# Patient Record
Sex: Female | Born: 1945 | Race: White | Hispanic: No | Marital: Single | Smoking: Current every day smoker
Health system: Southern US, Community
[De-identification: ages and names within clinical notes are randomized; demographics above are authoritative.]

## PROBLEM LIST (undated history)

## (undated) DIAGNOSIS — F419 Anxiety disorder, unspecified: Secondary | ICD-10-CM

## (undated) DIAGNOSIS — E079 Disorder of thyroid, unspecified: Secondary | ICD-10-CM

## (undated) DIAGNOSIS — I10 Essential (primary) hypertension: Secondary | ICD-10-CM

## (undated) HISTORY — PX: APPENDECTOMY: SHX54

---

## 2008-07-18 ENCOUNTER — Emergency Department: Payer: Self-pay | Admitting: Emergency Medicine

## 2009-08-03 ENCOUNTER — Ambulatory Visit: Payer: Self-pay | Admitting: Internal Medicine

## 2010-06-01 ENCOUNTER — Ambulatory Visit: Payer: Self-pay | Admitting: Internal Medicine

## 2010-11-20 ENCOUNTER — Ambulatory Visit: Payer: Self-pay | Admitting: Internal Medicine

## 2011-07-24 ENCOUNTER — Ambulatory Visit: Payer: Self-pay | Admitting: Internal Medicine

## 2011-09-02 ENCOUNTER — Ambulatory Visit: Payer: Self-pay | Admitting: Internal Medicine

## 2012-03-13 ENCOUNTER — Ambulatory Visit: Payer: Self-pay | Admitting: Unknown Physician Specialty

## 2012-03-16 LAB — PATHOLOGY REPORT

## 2012-09-21 ENCOUNTER — Ambulatory Visit: Payer: Self-pay | Admitting: Internal Medicine

## 2013-09-22 ENCOUNTER — Ambulatory Visit: Payer: Self-pay | Admitting: Internal Medicine

## 2014-06-05 ENCOUNTER — Ambulatory Visit: Payer: Self-pay

## 2014-06-05 LAB — RAPID STREP-A WITH REFLX: Micro Text Report: NEGATIVE

## 2014-06-08 LAB — BETA STREP CULTURE(ARMC)

## 2014-06-09 ENCOUNTER — Ambulatory Visit: Payer: Self-pay

## 2014-06-15 ENCOUNTER — Emergency Department: Payer: Self-pay | Admitting: Emergency Medicine

## 2015-03-28 ENCOUNTER — Other Ambulatory Visit: Payer: Self-pay | Admitting: Neurology

## 2015-03-28 DIAGNOSIS — G5139 Clonic hemifacial spasm, unspecified: Secondary | ICD-10-CM

## 2015-04-03 ENCOUNTER — Ambulatory Visit
Admission: RE | Admit: 2015-04-03 | Discharge: 2015-04-03 | Disposition: A | Discharge status: Home / Self Care | Payer: Medicare HMO | Source: Ambulatory Visit | Attending: Neurology | Admitting: Neurology

## 2015-04-03 DIAGNOSIS — I679 Cerebrovascular disease, unspecified: Secondary | ICD-10-CM | POA: Diagnosis not present

## 2015-04-03 DIAGNOSIS — G513 Clonic hemifacial spasm: Secondary | ICD-10-CM | POA: Diagnosis present

## 2015-04-03 DIAGNOSIS — G5139 Clonic hemifacial spasm, unspecified: Secondary | ICD-10-CM

## 2015-04-03 MED ORDER — GADOBENATE DIMEGLUMINE 529 MG/ML IV SOLN
10.0000 mL | Freq: Once | INTRAVENOUS | Status: AC | PRN
  Administered 2015-04-03: 9 mL via INTRAVENOUS

## 2015-04-05 ENCOUNTER — Ambulatory Visit: Payer: Self-pay

## 2015-08-28 ENCOUNTER — Other Ambulatory Visit: Payer: Self-pay | Admitting: Internal Medicine

## 2015-08-28 DIAGNOSIS — Z1231 Encounter for screening mammogram for malignant neoplasm of breast: Secondary | ICD-10-CM

## 2015-09-12 ENCOUNTER — Ambulatory Visit
Admission: RE | Admit: 2015-09-12 | Discharge: 2015-09-12 | Disposition: A | Discharge status: Home / Self Care | Payer: Medicare HMO | Source: Ambulatory Visit | Attending: Internal Medicine | Admitting: Internal Medicine

## 2015-09-12 ENCOUNTER — Other Ambulatory Visit: Payer: Self-pay | Admitting: Internal Medicine

## 2015-09-12 DIAGNOSIS — N63 Unspecified lump in breast: Secondary | ICD-10-CM | POA: Insufficient documentation

## 2015-09-12 DIAGNOSIS — Z1231 Encounter for screening mammogram for malignant neoplasm of breast: Secondary | ICD-10-CM | POA: Insufficient documentation

## 2015-09-12 DIAGNOSIS — R921 Mammographic calcification found on diagnostic imaging of breast: Secondary | ICD-10-CM | POA: Diagnosis not present

## 2015-09-19 ENCOUNTER — Other Ambulatory Visit: Payer: Self-pay | Admitting: Internal Medicine

## 2015-09-19 DIAGNOSIS — R928 Other abnormal and inconclusive findings on diagnostic imaging of breast: Secondary | ICD-10-CM

## 2015-10-09 ENCOUNTER — Ambulatory Visit
Admission: RE | Admit: 2015-10-09 | Discharge: 2015-10-09 | Disposition: A | Discharge status: Home / Self Care | Payer: Medicare HMO | Source: Ambulatory Visit | Attending: Internal Medicine | Admitting: Internal Medicine

## 2015-10-09 DIAGNOSIS — R928 Other abnormal and inconclusive findings on diagnostic imaging of breast: Secondary | ICD-10-CM | POA: Insufficient documentation

## 2015-10-09 DIAGNOSIS — R920 Mammographic microcalcification found on diagnostic imaging of breast: Secondary | ICD-10-CM | POA: Diagnosis not present

## 2015-10-11 ENCOUNTER — Other Ambulatory Visit: Payer: Self-pay | Admitting: Internal Medicine

## 2015-10-11 DIAGNOSIS — R920 Mammographic microcalcification found on diagnostic imaging of breast: Secondary | ICD-10-CM

## 2015-10-18 ENCOUNTER — Ambulatory Visit: Payer: Medicare HMO

## 2015-10-23 ENCOUNTER — Ambulatory Visit
Admission: RE | Admit: 2015-10-23 | Discharge: 2015-10-23 | Disposition: A | Discharge status: Home / Self Care | Payer: Medicare HMO | Source: Ambulatory Visit | Attending: Internal Medicine | Admitting: Internal Medicine

## 2015-10-23 DIAGNOSIS — R920 Mammographic microcalcification found on diagnostic imaging of breast: Secondary | ICD-10-CM

## 2015-10-23 DIAGNOSIS — N6082 Other benign mammary dysplasias of left breast: Secondary | ICD-10-CM | POA: Insufficient documentation

## 2015-10-23 DIAGNOSIS — R921 Mammographic calcification found on diagnostic imaging of breast: Secondary | ICD-10-CM | POA: Insufficient documentation

## 2015-10-24 LAB — SURGICAL PATHOLOGY

## 2015-10-25 HISTORY — PX: BREAST BIOPSY: SHX20

## 2016-05-10 ENCOUNTER — Other Ambulatory Visit: Payer: Self-pay | Admitting: Internal Medicine

## 2016-05-10 DIAGNOSIS — R920 Mammographic microcalcification found on diagnostic imaging of breast: Secondary | ICD-10-CM

## 2016-05-27 ENCOUNTER — Ambulatory Visit
Admission: RE | Admit: 2016-05-27 | Discharge: 2016-05-27 | Disposition: A | Discharge status: Home / Self Care | Payer: Medicare HMO | Source: Ambulatory Visit | Attending: Internal Medicine | Admitting: Internal Medicine

## 2016-05-27 DIAGNOSIS — R920 Mammographic microcalcification found on diagnostic imaging of breast: Secondary | ICD-10-CM | POA: Insufficient documentation

## 2017-09-01 ENCOUNTER — Other Ambulatory Visit: Payer: Self-pay | Admitting: Internal Medicine

## 2017-09-01 DIAGNOSIS — Z1231 Encounter for screening mammogram for malignant neoplasm of breast: Secondary | ICD-10-CM

## 2017-10-01 ENCOUNTER — Other Ambulatory Visit: Payer: Self-pay | Admitting: Internal Medicine

## 2017-10-01 ENCOUNTER — Ambulatory Visit
Admission: RE | Admit: 2017-10-01 | Discharge: 2017-10-01 | Disposition: A | Discharge status: Home / Self Care | Payer: Medicare HMO | Source: Ambulatory Visit | Attending: Internal Medicine | Admitting: Internal Medicine

## 2017-10-01 DIAGNOSIS — Z1231 Encounter for screening mammogram for malignant neoplasm of breast: Secondary | ICD-10-CM | POA: Diagnosis present

## 2018-04-21 IMAGING — MG MM DIGITAL SCREENING BILAT W/ TOMO W/ CAD
8 of 10 series · 9 of 22 positions shown · non-contrast
Comparison: Previous exam(s).

CLINICAL DATA: Screening.

EXAM:
2D DIGITAL SCREENING BILATERAL MAMMOGRAM WITH 3D TOMO WITH CAD

[L MLO]
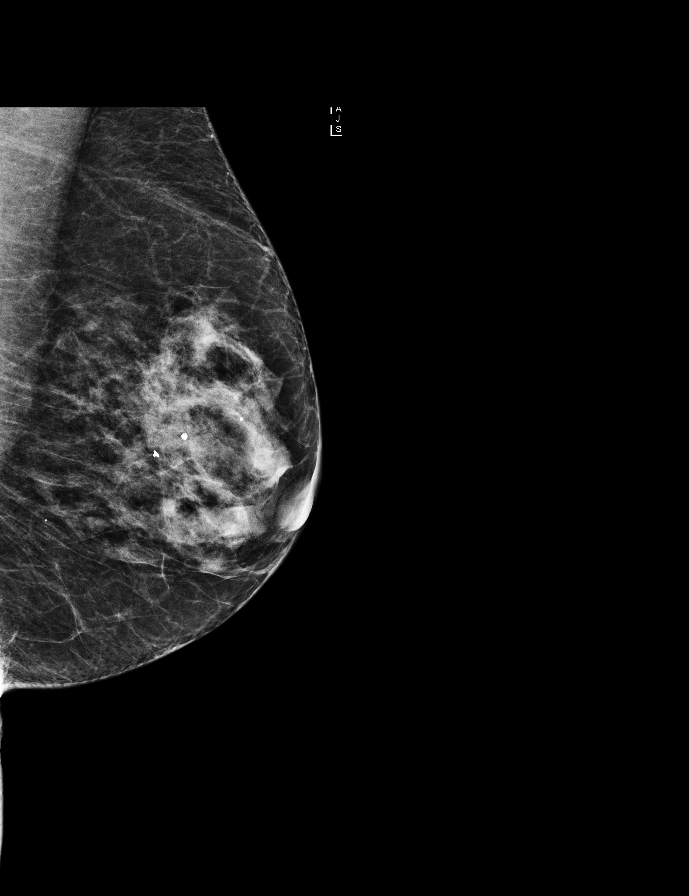

[R CC synth-2D]
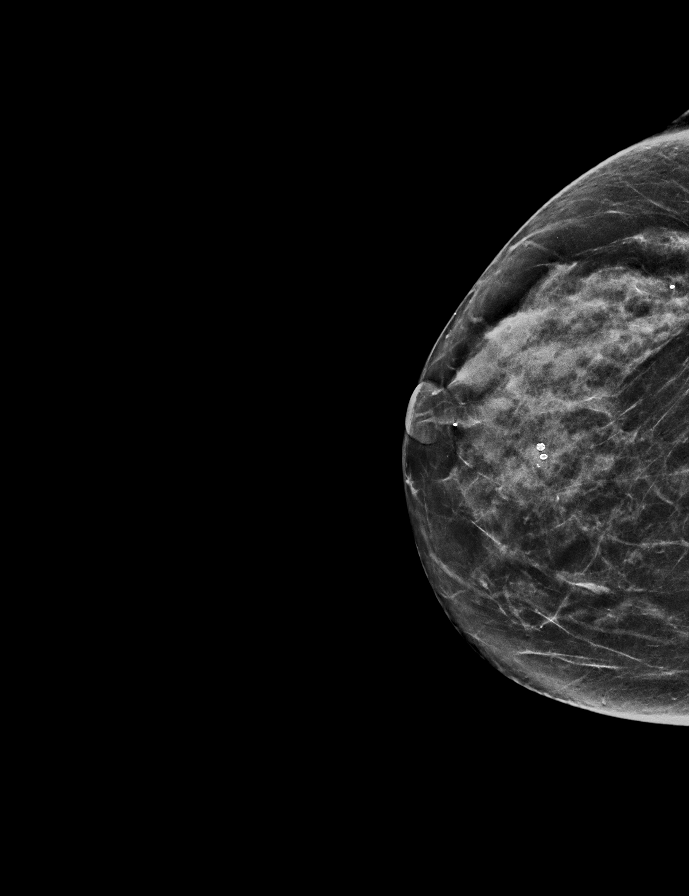

[R MLO synth-2D]
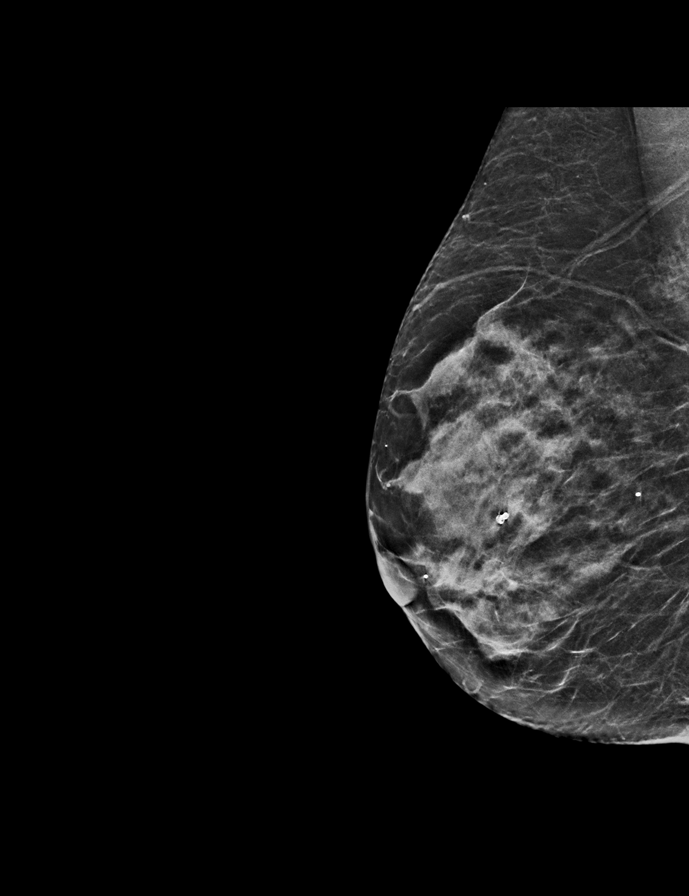

[R CC]
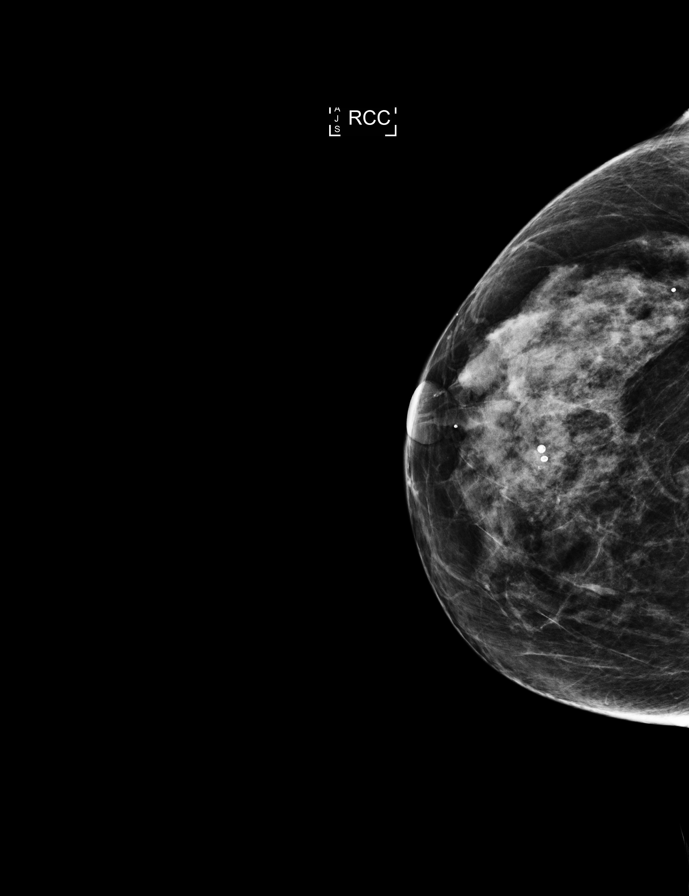

[L CC synth-2D]
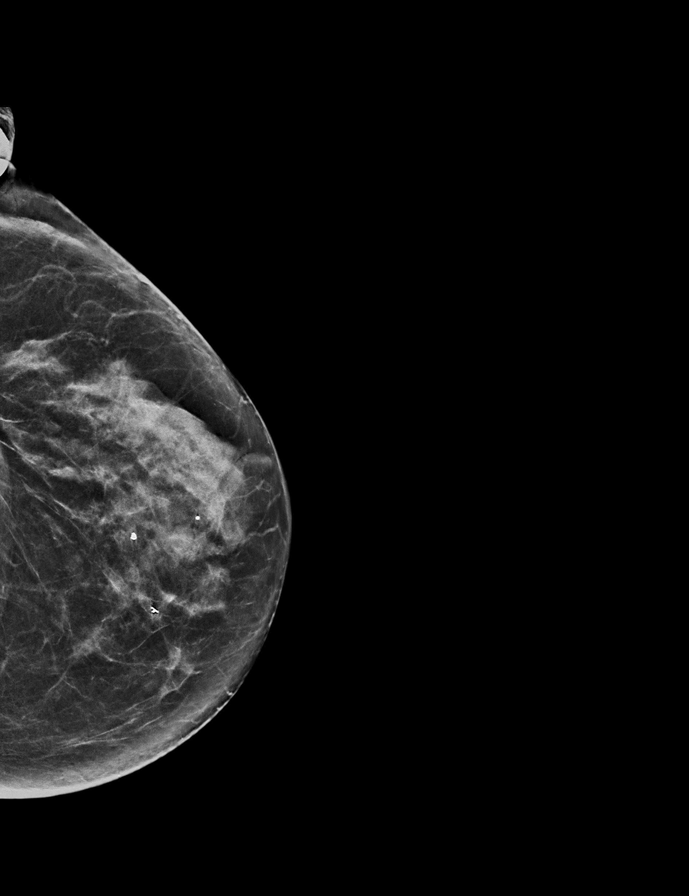

[L CC]
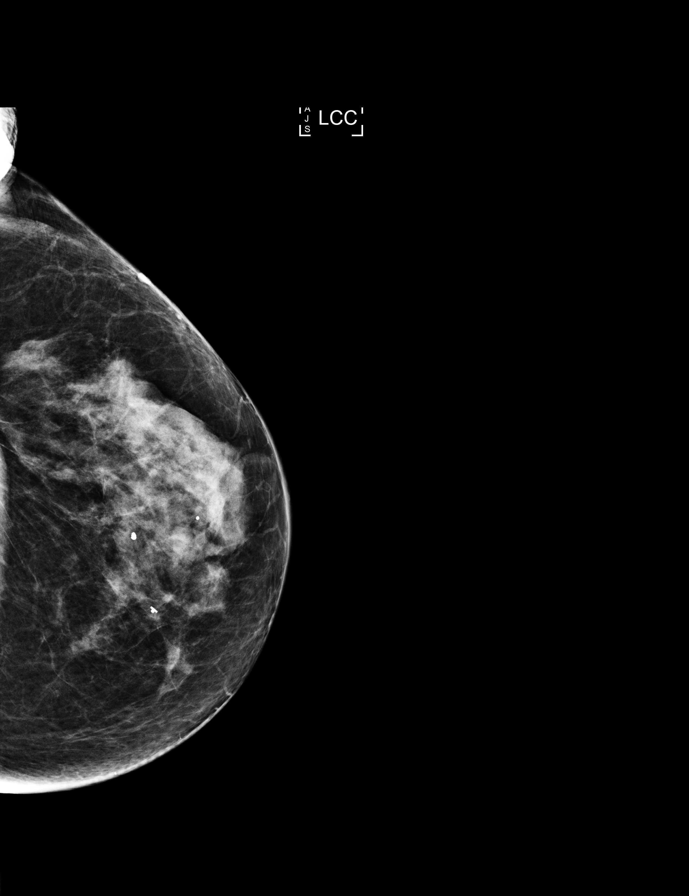

[R MLO]
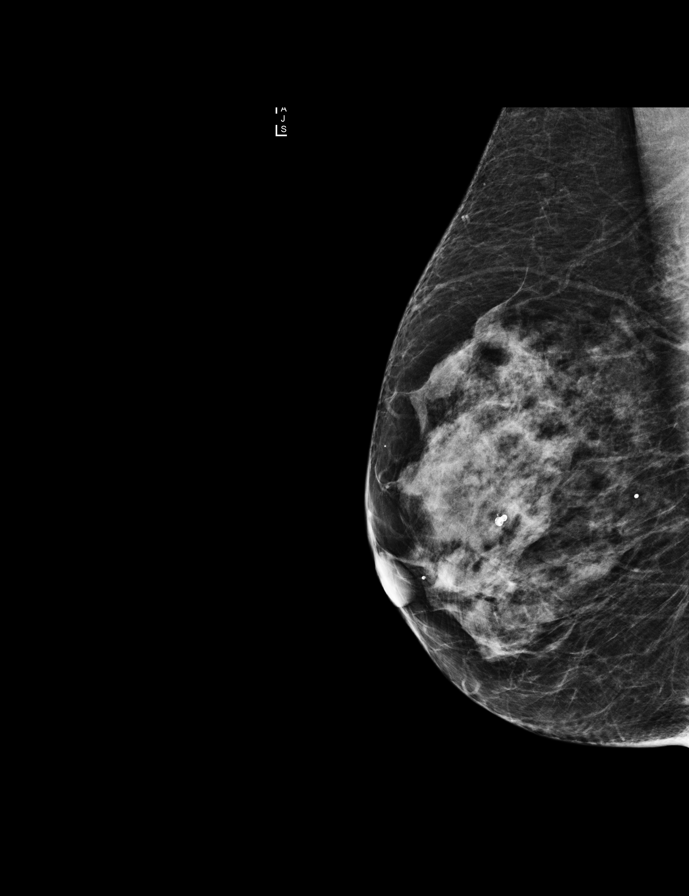

[L MLO tomo · 2 of 51 frames shown]
[frame 17/51]
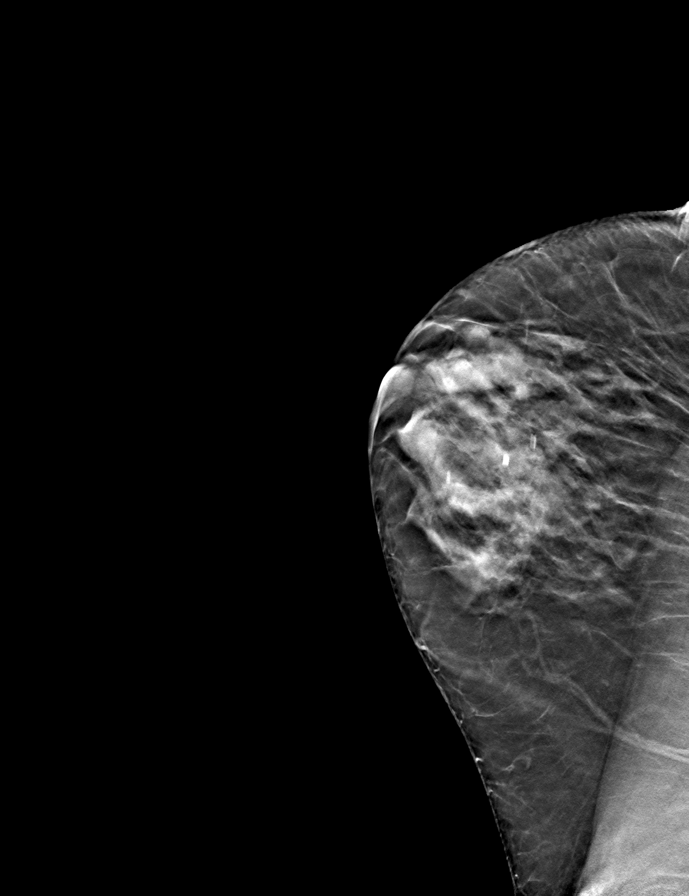
[frame 26/51]
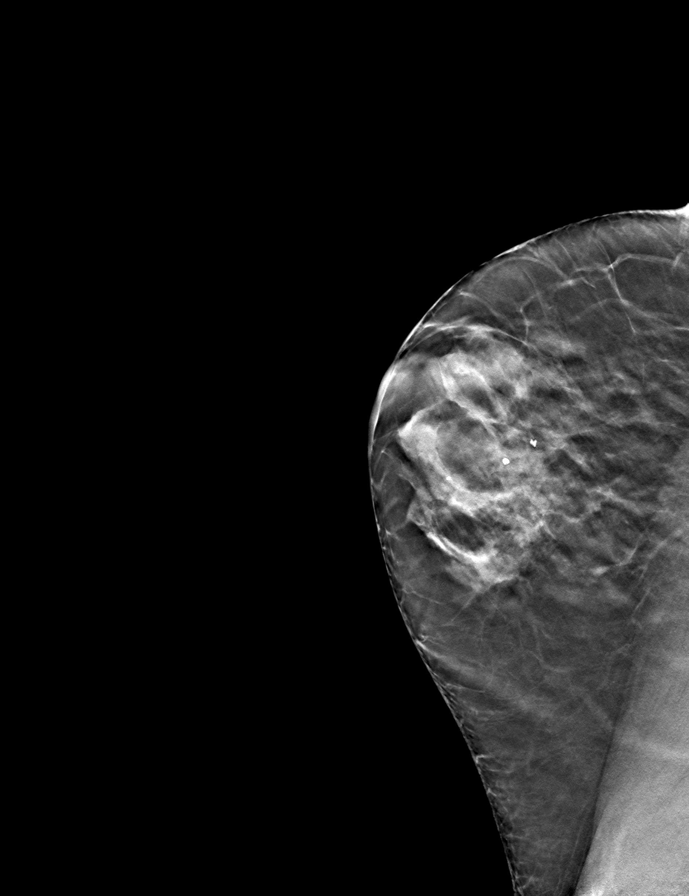

[9 of 22 positions shown; findings below may reference images not displayed]

ACR Breast Density Category c: The breast tissue is heterogeneously
dense, which may obscure small masses.
FINDINGS: There are no findings suspicious for malignancy. Images were
processed with CAD.
IMPRESSION: No mammographic evidence of malignancy. A result letter of this
screening mammogram will be mailed directly to the patient.

RECOMMENDATION:
Screening mammogram in one year. (Code:[id number]-[id number])

BI-RADS CATEGORY  1: Negative.

## 2018-12-16 ENCOUNTER — Other Ambulatory Visit: Payer: Self-pay | Admitting: Neurology

## 2018-12-16 DIAGNOSIS — R413 Other amnesia: Secondary | ICD-10-CM

## 2019-01-20 ENCOUNTER — Ambulatory Visit: Admission: RE | Admit: 2019-01-20 | Payer: Medicare HMO | Source: Ambulatory Visit

## 2019-01-21 ENCOUNTER — Other Ambulatory Visit: Payer: Self-pay

## 2019-01-21 ENCOUNTER — Ambulatory Visit
Admission: RE | Admit: 2019-01-21 | Discharge: 2019-01-21 | Disposition: A | Discharge status: Home / Self Care | Payer: Medicare HMO | Source: Ambulatory Visit | Attending: Neurology | Admitting: Neurology

## 2019-01-21 DIAGNOSIS — R413 Other amnesia: Secondary | ICD-10-CM | POA: Diagnosis present

## 2019-10-15 ENCOUNTER — Other Ambulatory Visit: Payer: Self-pay | Admitting: Internal Medicine

## 2019-10-15 DIAGNOSIS — Z1231 Encounter for screening mammogram for malignant neoplasm of breast: Secondary | ICD-10-CM

## 2019-10-26 ENCOUNTER — Ambulatory Visit: Payer: Medicare HMO

## 2019-12-01 ENCOUNTER — Ambulatory Visit
Admission: RE | Admit: 2019-12-01 | Discharge: 2019-12-01 | Disposition: A | Discharge status: Home / Self Care | Payer: Medicare HMO | Source: Ambulatory Visit | Attending: Internal Medicine | Admitting: Internal Medicine

## 2019-12-01 DIAGNOSIS — Z1231 Encounter for screening mammogram for malignant neoplasm of breast: Secondary | ICD-10-CM | POA: Diagnosis present

## 2020-10-26 ENCOUNTER — Other Ambulatory Visit (HOSPITAL_COMMUNITY): Payer: Self-pay | Admitting: Ophthalmology

## 2020-10-26 ENCOUNTER — Other Ambulatory Visit: Payer: Self-pay | Admitting: Ophthalmology

## 2020-10-26 DIAGNOSIS — H5789 Other specified disorders of eye and adnexa: Secondary | ICD-10-CM

## 2020-10-26 DIAGNOSIS — E0500 Thyrotoxicosis with diffuse goiter without thyrotoxic crisis or storm: Secondary | ICD-10-CM

## 2020-11-02 ENCOUNTER — Ambulatory Visit: Payer: Medicare HMO

## 2020-11-08 ENCOUNTER — Ambulatory Visit
Admission: RE | Admit: 2020-11-08 | Discharge: 2020-11-08 | Disposition: A | Discharge status: Home / Self Care | Payer: Medicare HMO | Source: Ambulatory Visit | Attending: Ophthalmology | Admitting: Ophthalmology

## 2020-11-08 ENCOUNTER — Other Ambulatory Visit: Payer: Self-pay

## 2020-11-08 DIAGNOSIS — E079 Disorder of thyroid, unspecified: Secondary | ICD-10-CM

## 2020-11-08 DIAGNOSIS — E0500 Thyrotoxicosis with diffuse goiter without thyrotoxic crisis or storm: Secondary | ICD-10-CM | POA: Diagnosis not present

## 2020-11-08 LAB — POCT I-STAT CREATININE: Creatinine, Ser: 0.9 mg/dL (ref 0.44–1.00)

## 2020-11-08 MED ORDER — IOHEXOL 300 MG/ML  SOLN
75.0000 mL | Freq: Once | INTRAMUSCULAR | Status: AC | PRN
  Administered 2020-11-08: 75 mL via INTRAVENOUS

## 2022-09-19 ENCOUNTER — Encounter: Payer: Self-pay | Admitting: Emergency Medicine

## 2022-09-19 ENCOUNTER — Ambulatory Visit (INDEPENDENT_AMBULATORY_CARE_PROVIDER_SITE_OTHER): Payer: Medicare HMO

## 2022-09-19 ENCOUNTER — Ambulatory Visit
Admission: EM | Admit: 2022-09-19 | Discharge: 2022-09-19 | Disposition: A | Discharge status: Home / Self Care | Payer: Medicare HMO | Attending: Family Medicine | Admitting: Family Medicine

## 2022-09-19 DIAGNOSIS — E785 Hyperlipidemia, unspecified: Secondary | ICD-10-CM | POA: Insufficient documentation

## 2022-09-19 DIAGNOSIS — F17210 Nicotine dependence, cigarettes, uncomplicated: Secondary | ICD-10-CM | POA: Diagnosis not present

## 2022-09-19 DIAGNOSIS — Z7902 Long term (current) use of antithrombotics/antiplatelets: Secondary | ICD-10-CM | POA: Diagnosis not present

## 2022-09-19 DIAGNOSIS — E079 Disorder of thyroid, unspecified: Secondary | ICD-10-CM | POA: Diagnosis not present

## 2022-09-19 DIAGNOSIS — Z9049 Acquired absence of other specified parts of digestive tract: Secondary | ICD-10-CM | POA: Insufficient documentation

## 2022-09-19 DIAGNOSIS — Z1152 Encounter for screening for COVID-19: Secondary | ICD-10-CM | POA: Diagnosis not present

## 2022-09-19 DIAGNOSIS — R0789 Other chest pain: Secondary | ICD-10-CM

## 2022-09-19 DIAGNOSIS — Z8673 Personal history of transient ischemic attack (TIA), and cerebral infarction without residual deficits: Secondary | ICD-10-CM | POA: Diagnosis not present

## 2022-09-19 DIAGNOSIS — Z79899 Other long term (current) drug therapy: Secondary | ICD-10-CM | POA: Insufficient documentation

## 2022-09-19 DIAGNOSIS — K219 Gastro-esophageal reflux disease without esophagitis: Secondary | ICD-10-CM | POA: Diagnosis not present

## 2022-09-19 DIAGNOSIS — I10 Essential (primary) hypertension: Secondary | ICD-10-CM | POA: Diagnosis not present

## 2022-09-19 DIAGNOSIS — E119 Type 2 diabetes mellitus without complications: Secondary | ICD-10-CM | POA: Diagnosis not present

## 2022-09-19 DIAGNOSIS — R072 Precordial pain: Secondary | ICD-10-CM | POA: Insufficient documentation

## 2022-09-19 DIAGNOSIS — J101 Influenza due to other identified influenza virus with other respiratory manifestations: Secondary | ICD-10-CM | POA: Diagnosis not present

## 2022-09-19 DIAGNOSIS — R079 Chest pain, unspecified: Secondary | ICD-10-CM | POA: Diagnosis not present

## 2022-09-19 DIAGNOSIS — Z86711 Personal history of pulmonary embolism: Secondary | ICD-10-CM | POA: Diagnosis not present

## 2022-09-19 DIAGNOSIS — F419 Anxiety disorder, unspecified: Secondary | ICD-10-CM | POA: Insufficient documentation

## 2022-09-19 HISTORY — DX: Anxiety disorder, unspecified: F41.9

## 2022-09-19 HISTORY — DX: Disorder of thyroid, unspecified: E07.9

## 2022-09-19 HISTORY — DX: Essential (primary) hypertension: I10

## 2022-09-19 LAB — CBC WITH DIFFERENTIAL/PLATELET
Abs Immature Granulocytes: 0.03 10*3/uL (ref 0.00–0.07)
Basophils Absolute: 0 10*3/uL (ref 0.0–0.1)
Basophils Relative: 1 %
Eosinophils Absolute: 0 10*3/uL (ref 0.0–0.5)
Eosinophils Relative: 1 %
HCT: 43.3 % (ref 36.0–46.0)
Hemoglobin: 14.6 g/dL (ref 12.0–15.0)
Immature Granulocytes: 1 %
Lymphocytes Relative: 14 %
Lymphs Abs: 0.9 10*3/uL (ref 0.7–4.0)
MCH: 32.2 pg (ref 26.0–34.0)
MCHC: 33.7 g/dL (ref 30.0–36.0)
MCV: 95.4 fL (ref 80.0–100.0)
Monocytes Absolute: 0.6 10*3/uL (ref 0.1–1.0)
Monocytes Relative: 9 %
Neutro Abs: 4.8 10*3/uL (ref 1.7–7.7)
Neutrophils Relative %: 74 %
Platelets: 241 10*3/uL (ref 150–400)
RBC: 4.54 MIL/uL (ref 3.87–5.11)
RDW: 13.5 % (ref 11.5–15.5)
WBC: 6.3 10*3/uL (ref 4.0–10.5)
nRBC: 0 % (ref 0.0–0.2)

## 2022-09-19 LAB — COMPREHENSIVE METABOLIC PANEL
ALT: 32 U/L (ref 0–44)
AST: 32 U/L (ref 15–41)
Albumin: 4.2 g/dL (ref 3.5–5.0)
Alkaline Phosphatase: 76 U/L (ref 38–126)
Anion gap: 8 (ref 5–15)
BUN: 10 mg/dL (ref 8–23)
CO2: 27 mmol/L (ref 22–32)
Calcium: 8.9 mg/dL (ref 8.9–10.3)
Chloride: 99 mmol/L (ref 98–111)
Creatinine, Ser: 0.86 mg/dL (ref 0.44–1.00)
GFR, Estimated: >60 mL/min (ref >60)
Glucose, Bld: 97 mg/dL (ref 70–99)
Potassium: 4 mmol/L (ref 3.5–5.1)
Sodium: 134 mmol/L — ABNORMAL LOW (ref 135–145)
Total Bilirubin: 0.3 mg/dL (ref 0.3–1.2)
Total Protein: 7 g/dL (ref 6.5–8.1)

## 2022-09-19 LAB — RESP PANEL BY RT-PCR (RSV, FLU A&B, COVID)  RVPGX2
Influenza A by PCR: POSITIVE — AB
Influenza B by PCR: NEGATIVE
Resp Syncytial Virus by PCR: NEGATIVE
SARS Coronavirus 2 by RT PCR: NEGATIVE

## 2022-09-19 LAB — TROPONIN I (HIGH SENSITIVITY): Troponin I (High Sensitivity): 3 ng/L (ref <18)

## 2022-09-19 MED ORDER — ALUM & MAG HYDROXIDE-SIMETH 200-200-20 MG/5ML PO SUSP
30.0000 mL | Freq: Once | ORAL | Status: AC
  Administered 2022-09-19: 30 mL via ORAL

## 2022-09-19 MED ORDER — OSELTAMIVIR PHOSPHATE 75 MG PO CAPS: 75.0000 mg | ORAL_CAPSULE | Freq: Two times a day (BID) | ORAL | 0 refills | Status: DC

## 2022-09-19 MED ORDER — LIDOCAINE VISCOUS HCL 2 % MT SOLN
15.0000 mL | Freq: Once | OROMUCOSAL | Status: AC
  Administered 2022-09-19: 15 mL via OROMUCOSAL

## 2022-09-19 NOTE — ED Provider Notes (Signed)
[facility]    CSN: [id number] Arrival date & time: 09/19/22  1224      History   Chief Complaint Chief Complaint  Patient presents with   Chest Pain    HPI Khiana Camino is a 77 y.o. female.   HPI  Batoul here for intermittent substernal chest pain last night.  She didn't get any rest.  She felt like it was heart burn.  Has history of GERD and takes Protonix but missed her medications yesterday.  She takes Plavix for questionable stroke in the past. Pain described as sharp and radiates upward. No abdominal pain.  Typically doesn't get pain with reflux.  She does not follow with a cardiologist.  Had to get an echo before that was "alright."  Has a cough with "a bad taste to it."  No fever.    Patient Denies: Nausea, Vomiting, Diaphoresis, Shortness of breath, Pleuritic pain, Leg swelling, Syncope, Recent immobility, and Fever  Pt reports  history of PE, DVT, DM, cancer, recent surgery, recent travel. Has history of HLD and HTN.    Tobacco use: yes a ppd for "too many years"    Past Medical History:  Diagnosis Date   Anxiety    Hypertension    Thyroid disease     There are no problems to display for this patient.   Past Surgical History:  Procedure Laterality Date   APPENDECTOMY     BREAST BIOPSY Left 10/25/2015   mild ductal estasia    OB History   No obstetric history on file.      Home Medications    Prior to Admission medications   Medication Sig Start Date End Date Taking? Authorizing Provider  ARIPiprazole (ABILIFY) 2 MG tablet Take by mouth. 07/31/22 07/26/23 Yes [provider]  clonazePAM (KLONOPIN) 1 MG tablet TAKE 1/2 (ONE-HALF) TABLET BY MOUTH TWICE DAILY AS NEEDED FOR ANXIETY 09/17/22  Yes [provider]  clopidogrel (PLAVIX) 75 MG tablet Take by mouth. 07/25/22  Yes [provider]  cycloSPORINE (RESTASIS) 0.05 % ophthalmic emulsion INSTILL 1 INTO EACH EYE TWICE DAILY 04/16/22  Yes [provider]  levothyroxine (SYNTHROID) 100 MCG tablet Take by mouth. 11/08/21 11/08/22 Yes [provider]  lovastatin (MEVACOR) 40 MG tablet Take by mouth. 09/13/22  Yes [provider]  memantine (NAMENDA) 5 MG tablet TAKE 5 MG BY MOUTH EVERY MORNING AND 10 MG BY MOUTH EVERY NIGHT 05/23/22  Yes [provider]  pantoprazole (PROTONIX) 40 MG tablet Take 1 tablet by mouth daily. 07/11/22  Yes [provider]  sertraline (ZOLOFT) 100 MG tablet Take by mouth. 07/31/22 07/26/23 Yes [provider]  solifenacin (VESICARE) 10 MG tablet Take 1 tablet by mouth daily. 08/05/22  Yes [provider]  traZODone (DESYREL) 50 MG tablet Take 1 tablet by mouth at bedtime as needed. 07/25/22  Yes [provider]  Vitamin D, Ergocalciferol, (DRISDOL) 1.25 MG (50000 UNIT) CAPS capsule Take 1 capsule by mouth once a week. 10/02/21  Yes [provider]  Calcium Carb-Cholecalciferol (CALCIUM HIGH POTENCY/VITAMIN D) 600-5 MG-MCG TABS Take 1 tablet by mouth daily.    [provider]  cyanocobalamin (VITAMIN B12) 1000 MCG tablet Take by mouth.    [provider]    Family History Family History  Problem Relation Age of Onset   Breast cancer Neg Hx     Social History Social History   Tobacco Use   Smoking status: Every Day    Types:  Cigarettes   Smokeless tobacco: Never  Vaping Use   Vaping Use: Never used  Substance Use Topics   Alcohol use: Yes    Comment: social   Drug use: Never     Allergies   Aspirin, Banana, Bupropion, Donepezil, Lamotrigine, and Varenicline   Review of Systems Review of Systems :negative unless otherwise stated in HPI.      Physical Exam Triage Vital Signs ED Triage Vitals  Enc Vitals Group     BP 09/19/22 1233 (!) 160/66     Pulse Rate 09/19/22 1233 91     Resp 09/19/22 1233 16     Temp 09/19/22 1233 98.6 F (37 C)     Temp Source 09/19/22 1233 Oral     SpO2 09/19/22 1233 97 %      Weight --      Height --      Head Circumference --      Peak Flow --      Pain Score 09/19/22 1228 0     Pain Loc --      Pain Edu? --      Excl. in [place]? --    No data found.  Updated Vital Signs BP (!) 160/66 (BP Location: Left Arm)   Pulse 91   Temp 98.6 F (37 C) (Oral)   Resp 16   SpO2 97%   Visual Acuity Right Eye Distance:   Left Eye Distance:   Bilateral Distance:    Right Eye Near:   Left Eye Near:    Bilateral Near:     Physical Exam  GEN: pleasant non-toxic appearing female, in no acute distress  CV: regular rate and rhythm,  no murmurs, rubs or gallops  appreciated,, no JVP  CHEST WALL: non-tender, no overlying skin changes, no deformity  RESP: no increased work of breathing, clear to ascultation bilaterally ABD: Bowel sounds present. Soft, non-tender, non-distended.  MSK: no edema, or calf tenderness SKIN: warm, dry, no rash on visible skin NEURO: alert, moves all extremities appropriately PSYCH: Normal affect, appropriate speech and behavior   UC Treatments / Results  Labs (all labs ordered are listed, but only abnormal results are displayed) Labs Reviewed  RESP PANEL BY RT-PCR (RSV, FLU A&B, COVID)  RVPGX2 - Abnormal; Notable for the following components:      Result Value   Influenza A by PCR POSITIVE (*)    All other components within normal limits  COMPREHENSIVE METABOLIC PANEL - Abnormal; Notable for the following components:   Sodium 134 (*)    All other components within normal limits  CBC WITH DIFFERENTIAL/PLATELET  TROPONIN I (HIGH SENSITIVITY)    EKG   Radiology DG Chest 2 View  Result Date: 09/19/2022 CLINICAL DATA:  Chest pain EXAM: CHEST - 2 VIEW COMPARISON:  None Available. FINDINGS: The heart size and mediastinal contours are within normal limits. Mild biapical pleural-parenchymal scarring. Both lungs are clear. The visualized skeletal structures are unremarkable. IMPRESSION: No active cardiopulmonary disease. Electronically  Signed   By: Yetta Glassman M.D.   On: 09/19/2022 13:44    Procedures Procedures (including critical care time)  Medications Ordered in UC Medications  lidocaine (XYLOCAINE) 2 % viscous mouth solution 15 mL (15 mLs Mouth/Throat Given 09/19/22 1356)  alum & mag hydroxide-simeth (MAALOX/MYLANTA) 200-200-20 MG/5ML suspension 30 mL (30 mLs Oral Given 09/19/22 1356)    Initial Impression / Assessment and Plan / UC Course  I have reviewed the triage vital signs and the nursing notes.  Pertinent  labs & imaging results that were available during my care of the patient were reviewed by me and considered in my medical decision making (see chart for details).       Patient is a 77 y.o. female with history of HTN, HLD, GERD and thyroid disease  who presents with acute substernal chest pain that started last night. Dyane has no history of PE or DVT.  Unable to [facility] pt out tdue to age.  EKG obtained and is without ST elevations, non-specific T wave changes, NSR. Risks of work up in the urgent care discussed. Pt understands that if anything requiring ED evaluation revealed she will need to go to the ED.   Obtain labs, CXR and trop.  She has some respiratory symptoms therefore obtain resp panel.  Given GI cocktail.  CBC unremarkable for anemia.  There were no significant electrolyte abnormalitis seen on CMP.  Chest x-ray did not show bacterial pneumonia, pleural effusions or pneumothorax. Initial hsTrop was negative.  I do not think his chest pain is cardiac in origin.  She is having reflux-like symptoms and was given a GI cocktail.  Exam not concerning for costochondritis however pain is reproducible, on exam.  This does not appear to be MSK related.  Return and ED precautions given and patient voiced understanding. Her COVID and RSV test were negative however her influenza A test is positive.  This also may be a cause of her chest discomfort.  Patient is within the treatment window for Tamiflu however she  declines this medication at this time.  Discussed MDM, treatment plan and plan for follow-up with patient who agrees with plan.     Final Clinical Impressions(s) / UC Diagnoses   Final diagnoses:  Influenza A  Other chest pain     Discharge Instructions      Your EKG, chest x-ray and enzyme for your heart were not concerning for acute heart attack.  Your salt level/sodium level was a little low otherwise all of your blood work was normal.   Your influenza a test was positive.  You have the flu.  This may be the cause of your chest pain.  There was no pneumonia seen on your chest x-ray.     ED Prescriptions       PDMP not reviewed this encounter.   Lyndee Hensen, DO 09/19/22 1418

## 2022-09-19 NOTE — ED Triage Notes (Signed)
Pt presents with central chest pain that started last night. She describes the pain as sharp and she has burning.

## 2022-09-19 NOTE — Discharge Instructions (Addendum)
Your EKG, chest x-ray and enzyme for your heart were not concerning for acute heart attack.  Your salt level/sodium level was a little low otherwise all of your blood work was normal.   Your influenza a test was positive.  You have the flu.  This may be the cause of your chest pain.  There was no pneumonia seen on your chest x-ray.

## 2023-03-18 ENCOUNTER — Ambulatory Visit
Admission: EM | Admit: 2023-03-18 | Discharge: 2023-03-18 | Disposition: A | Discharge status: Home / Self Care | Payer: Medicare HMO | Attending: Physician Assistant | Admitting: Physician Assistant

## 2023-03-18 DIAGNOSIS — H938X1 Other specified disorders of right ear: Secondary | ICD-10-CM

## 2023-03-18 DIAGNOSIS — H9191 Unspecified hearing loss, right ear: Secondary | ICD-10-CM

## 2023-03-18 DIAGNOSIS — H6121 Impacted cerumen, right ear: Secondary | ICD-10-CM | POA: Diagnosis not present

## 2023-03-18 NOTE — ED Provider Notes (Signed)
[facility]    CSN: [id number] Arrival date & time: 03/18/23  1534      History   Chief Complaint No chief complaint on file.   HPI Elizabeth Forbes is a 77 y.o. female presenting for right ear fullness and pressure.  She is concerned she could have earwax plugging her ear canal and she request to have it cleaned out.  Reports that she has some itching of the left side.  HPI  Past Medical History:  Diagnosis Date   Anxiety    Hypertension    Thyroid disease     There are no problems to display for this patient.   Past Surgical History:  Procedure Laterality Date   APPENDECTOMY     BREAST BIOPSY Left 10/25/2015   mild ductal estasia    OB History   No obstetric history on file.      Home Medications    Prior to Admission medications   Medication Sig Start Date End Date Taking? Authorizing Provider  ARIPiprazole (ABILIFY) 2 MG tablet Take by mouth. 07/31/22 07/26/23  [provider]  Calcium Carb-Cholecalciferol (CALCIUM HIGH POTENCY/VITAMIN D) 600-5 MG-MCG TABS Take 1 tablet by mouth daily.    [provider]  clonazePAM (KLONOPIN) 1 MG tablet TAKE 1/2 (ONE-HALF) TABLET BY MOUTH TWICE DAILY AS NEEDED FOR ANXIETY 09/17/22   [provider]  clopidogrel (PLAVIX) 75 MG tablet Take by mouth. 07/25/22   [provider]  cyanocobalamin (VITAMIN B12) 1000 MCG tablet Take by mouth.    [provider]  cycloSPORINE (RESTASIS) 0.05 % ophthalmic emulsion INSTILL 1 INTO EACH EYE TWICE DAILY 04/16/22   [provider]  levothyroxine (SYNTHROID) 100 MCG tablet Take by mouth. 11/08/21 11/08/22  [provider]  lovastatin (MEVACOR) 40 MG tablet Take by mouth. 09/13/22   [provider]  memantine (NAMENDA) 5 MG tablet TAKE 5 MG BY MOUTH EVERY MORNING AND 10 MG BY MOUTH EVERY NIGHT 05/23/22   [provider]  pantoprazole (PROTONIX) 40 MG tablet Take 1 tablet by mouth daily. 07/11/22    [provider]  sertraline (ZOLOFT) 100 MG tablet Take by mouth. 07/31/22 07/26/23  [provider]  solifenacin (VESICARE) 10 MG tablet Take 1 tablet by mouth daily. 08/05/22   [provider]  traZODone (DESYREL) 50 MG tablet Take 1 tablet by mouth at bedtime as needed. 07/25/22   [provider]  Vitamin D, Ergocalciferol, (DRISDOL) 1.25 MG (50000 UNIT) CAPS capsule Take 1 capsule by mouth once a week. 10/02/21   [provider]    Family History Family History  Problem Relation Age of Onset   Breast cancer Neg Hx     Social History Social History   Tobacco Use   Smoking status: Every Day    Types: Cigarettes   Smokeless tobacco: Never  Vaping Use   Vaping Use: Never used  Substance Use Topics   Alcohol use: Yes    Comment: social   Drug use: Never     Allergies   Aspirin, Banana, Bupropion, Donepezil, Lamotrigine, and Varenicline   Review of Systems Review of Systems  Constitutional:  Negative for chills, diaphoresis, fatigue and fever.  HENT:  Positive for hearing loss. Negative for congestion, ear discharge, ear pain, rhinorrhea, sinus pressure, sinus pain and sore throat.   Respiratory:  Negative for cough.   Gastrointestinal:  Negative for nausea and vomiting.  Neurological:  Negative for dizziness, weakness and headaches.  Hematological:  Negative for  adenopathy.     Physical Exam Triage Vital Signs ED Triage Vitals  Enc Vitals Group     BP      Pulse      Resp      Temp      Temp src      SpO2      Weight      Height      Head Circumference      Peak Flow      Pain Score      Pain Loc      Pain Edu?      Excl. in [place]?    No data found.  Updated Vital Signs BP (!) 148/75 (BP Location: Right Arm)   Pulse 66   Temp 98.5 F (36.9 C) (Oral)   SpO2 95%      Physical Exam Vitals and nursing note reviewed.  Constitutional:      General: She is not in acute distress.    Appearance: Normal  appearance. She is not ill-appearing or toxic-appearing.  HENT:     Head: Normocephalic and atraumatic.     Right Ear: Tympanic membrane, ear canal and external ear normal. There is impacted cerumen.     Left Ear: Tympanic membrane, ear canal and external ear normal.     Nose: Nose normal.     Mouth/Throat:     Mouth: Mucous membranes are moist.     Pharynx: Oropharynx is clear.  Eyes:     General: No scleral icterus.       Right eye: No discharge.        Left eye: No discharge.     Conjunctiva/sclera: Conjunctivae normal.  Cardiovascular:     Rate and Rhythm: Normal rate.  Pulmonary:     Effort: Pulmonary effort is normal. No respiratory distress.  Musculoskeletal:     Cervical back: Neck supple.  Skin:    General: Skin is dry.  Neurological:     General: No focal deficit present.     Mental Status: She is alert. Mental status is at baseline.     Motor: No weakness.     Gait: Gait normal.  Psychiatric:        Mood and Affect: Mood normal.        Behavior: Behavior normal.        Thought Content: Thought content normal.      UC Treatments / Results  Labs (all labs ordered are listed, but only abnormal results are displayed) Labs Reviewed - No data to display  EKG   Radiology No results found.  Procedures Procedures (including critical care time)  Medications Ordered in UC Medications - No data to display  Initial Impression / Assessment and Plan / UC Course  I have reviewed the triage vital signs and the nursing notes.  Pertinent labs & imaging results that were available during my care of the patient were reviewed by me and considered in my medical decision making (see chart for details).   77 year old female presents for right ear fullness and pressure with reduced hearing of the right side.  She is concerned about cerumen impaction.  On exam she does have impacted cerumen of the right EAC.  Otic lavage ordered to be performed by nursing staff. Otic lavage  performed by  nursing staff with total removal of cerumen and patient reported resolution of symptoms.   Final Clinical Impressions(s) / UC Diagnoses   Final diagnoses:  Impacted cerumen of right ear  Discharge Instructions   None    ED Prescriptions   None    PDMP not reviewed this encounter.   Shirlee Latch, PA-C 03/18/23 1614

## 2023-03-18 NOTE — ED Triage Notes (Signed)
Pt presents to UC c/o RT ear stopped up

## 2023-04-07 ENCOUNTER — Other Ambulatory Visit: Payer: Self-pay | Admitting: Internal Medicine

## 2023-04-07 DIAGNOSIS — I10 Essential (primary) hypertension: Secondary | ICD-10-CM

## 2023-04-07 DIAGNOSIS — F17210 Nicotine dependence, cigarettes, uncomplicated: Secondary | ICD-10-CM

## 2023-04-09 ENCOUNTER — Ambulatory Visit: Payer: Medicare HMO

## 2023-04-24 ENCOUNTER — Ambulatory Visit: Payer: Medicare HMO

## 2023-05-01 ENCOUNTER — Ambulatory Visit: Payer: Medicare HMO

## 2023-05-08 ENCOUNTER — Ambulatory Visit
Admission: RE | Admit: 2023-05-08 | Discharge: 2023-05-08 | Disposition: A | Discharge status: Home / Self Care | Payer: Medicare HMO | Source: Ambulatory Visit | Attending: Internal Medicine | Admitting: Internal Medicine

## 2023-05-08 DIAGNOSIS — I10 Essential (primary) hypertension: Secondary | ICD-10-CM | POA: Diagnosis present

## 2023-05-08 DIAGNOSIS — F17210 Nicotine dependence, cigarettes, uncomplicated: Secondary | ICD-10-CM | POA: Diagnosis present

## 2023-05-22 LAB — COLOGUARD

## 2023-06-09 LAB — COLOGUARD: COLOGUARD: NEGATIVE

## 2024-04-07 ENCOUNTER — Other Ambulatory Visit: Payer: Self-pay | Admitting: Physical Medicine & Rehabilitation

## 2024-04-07 DIAGNOSIS — G8929 Other chronic pain: Secondary | ICD-10-CM

## 2024-04-08 ENCOUNTER — Encounter: Payer: Self-pay | Admitting: Physical Medicine & Rehabilitation
